# Patient Record
Sex: Female | Born: 1960 | Race: Black or African American | Hispanic: No | Marital: Married | Smoking: Current every day smoker
Health system: Southern US, Community
[De-identification: ages and names within clinical notes are randomized; demographics above are authoritative.]

## PROBLEM LIST (undated history)

## (undated) DIAGNOSIS — K859 Acute pancreatitis without necrosis or infection, unspecified: Secondary | ICD-10-CM

## (undated) DIAGNOSIS — I10 Essential (primary) hypertension: Secondary | ICD-10-CM

---

## 2019-04-20 ENCOUNTER — Emergency Department (HOSPITAL_COMMUNITY)
Admission: EM | Admit: 2019-04-20 | Discharge: 2019-04-20 | Disposition: A | Discharge status: Home / Self Care | Payer: Self-pay | Attending: Emergency Medicine | Admitting: Emergency Medicine

## 2019-04-20 ENCOUNTER — Other Ambulatory Visit: Payer: Self-pay

## 2019-04-20 ENCOUNTER — Emergency Department (HOSPITAL_COMMUNITY): Payer: Self-pay

## 2019-04-20 ENCOUNTER — Encounter (HOSPITAL_COMMUNITY): Payer: Self-pay | Admitting: Emergency Medicine

## 2019-04-20 DIAGNOSIS — R079 Chest pain, unspecified: Secondary | ICD-10-CM | POA: Insufficient documentation

## 2019-04-20 DIAGNOSIS — R1011 Right upper quadrant pain: Secondary | ICD-10-CM | POA: Insufficient documentation

## 2019-04-20 DIAGNOSIS — I10 Essential (primary) hypertension: Secondary | ICD-10-CM | POA: Insufficient documentation

## 2019-04-20 DIAGNOSIS — F17210 Nicotine dependence, cigarettes, uncomplicated: Secondary | ICD-10-CM | POA: Insufficient documentation

## 2019-04-20 HISTORY — DX: Acute pancreatitis without necrosis or infection, unspecified: K85.90

## 2019-04-20 HISTORY — DX: Essential (primary) hypertension: I10

## 2019-04-20 LAB — CBC WITH DIFFERENTIAL/PLATELET
Abs Immature Granulocytes: 0.04 10*3/uL (ref 0.00–0.07)
Basophils Absolute: 0.1 10*3/uL (ref 0.0–0.1)
Basophils Relative: 1 %
Eosinophils Absolute: 0.1 10*3/uL (ref 0.0–0.5)
Eosinophils Relative: 0 %
HCT: 42.9 % (ref 36.0–46.0)
Hemoglobin: 14.1 g/dL (ref 12.0–15.0)
Immature Granulocytes: 0 %
Lymphocytes Relative: 21 %
Lymphs Abs: 2.7 10*3/uL (ref 0.7–4.0)
MCH: 30.2 pg (ref 26.0–34.0)
MCHC: 32.9 g/dL (ref 30.0–36.0)
MCV: 91.9 fL (ref 80.0–100.0)
Monocytes Absolute: 0.6 10*3/uL (ref 0.1–1.0)
Monocytes Relative: 5 %
Neutro Abs: 9.4 10*3/uL — ABNORMAL HIGH (ref 1.7–7.7)
Neutrophils Relative %: 73 %
Platelets: 264 10*3/uL (ref 150–400)
RBC: 4.67 MIL/uL (ref 3.87–5.11)
RDW: 12.8 % (ref 11.5–15.5)
WBC: 12.8 10*3/uL — ABNORMAL HIGH (ref 4.0–10.5)
nRBC: 0 % (ref 0.0–0.2)

## 2019-04-20 LAB — COMPREHENSIVE METABOLIC PANEL
ALT: 13 U/L (ref 0–44)
AST: 15 U/L (ref 15–41)
Albumin: 3.7 g/dL (ref 3.5–5.0)
Alkaline Phosphatase: 100 U/L (ref 38–126)
Anion gap: 11 (ref 5–15)
BUN: 5 mg/dL — ABNORMAL LOW (ref 6–20)
CO2: 24 mmol/L (ref 22–32)
Calcium: 9 mg/dL (ref 8.9–10.3)
Chloride: 104 mmol/L (ref 98–111)
Creatinine, Ser: 0.61 mg/dL (ref 0.44–1.00)
GFR calc Af Amer: >60 mL/min (ref >60)
GFR calc non Af Amer: >60 mL/min (ref >60)
Glucose, Bld: 136 mg/dL — ABNORMAL HIGH (ref 70–99)
Potassium: 3.9 mmol/L (ref 3.5–5.1)
Sodium: 139 mmol/L (ref 135–145)
Total Bilirubin: 0.5 mg/dL (ref 0.3–1.2)
Total Protein: 7.4 g/dL (ref 6.5–8.1)

## 2019-04-20 LAB — D-DIMER, QUANTITATIVE: D-Dimer, Quant: 1.03 ug/mL-FEU — ABNORMAL HIGH (ref 0.00–0.50)

## 2019-04-20 LAB — TROPONIN I
Troponin I: <0.03 ng/mL (ref <0.03)
Troponin I: <0.03 ng/mL (ref <0.03)

## 2019-04-20 LAB — LIPASE, BLOOD: Lipase: 31 U/L (ref 11–51)

## 2019-04-20 MED ORDER — NITROGLYCERIN 0.4 MG SL SUBL
0.4000 mg | SUBLINGUAL_TABLET | SUBLINGUAL | Status: DC | PRN
  Filled 2019-04-20: qty 1

## 2019-04-20 MED ORDER — MORPHINE SULFATE (PF) 4 MG/ML IV SOLN
4.0000 mg | Freq: Once | INTRAVENOUS | Status: AC
  Administered 2019-04-20: 4 mg via INTRAVENOUS
  Filled 2019-04-20: qty 1

## 2019-04-20 MED ORDER — ASPIRIN 81 MG PO CHEW
324.0000 mg | CHEWABLE_TABLET | Freq: Once | ORAL | Status: AC
  Administered 2019-04-20: 16:00:00 324 mg via ORAL
  Filled 2019-04-20: qty 4

## 2019-04-20 MED ORDER — IOHEXOL 350 MG/ML SOLN
100.0000 mL | Freq: Once | INTRAVENOUS | Status: AC | PRN
  Administered 2019-04-20: 19:00:00 100 mL via INTRAVENOUS

## 2019-04-20 MED ORDER — SODIUM CHLORIDE 0.9 % IV BOLUS
1000.0000 mL | Freq: Once | INTRAVENOUS | Status: AC
  Administered 2019-04-20: 1000 mL via INTRAVENOUS

## 2019-04-20 NOTE — ED Notes (Signed)
Pt left AMA with signatures obtained. She was repeatedly encouraged to stay, but was adamant about leaving. IV access was d/c.   Opportunity for questioning and answers were provided. Armband removed by staff, pt left AMA from ED.

## 2019-04-20 NOTE — ED Triage Notes (Signed)
Pt here from home visiting from Out of town with c/o chest pain and abd pain , forst HR 150 now down to 107 , pt received 4mg  of Zofran

## 2019-04-20 NOTE — ED Provider Notes (Signed)
[facility] EMERGENCY DEPARTMENT Provider Note   CSN: [id number] Arrival date & time: 04/20/19  1542    History   Chief Complaint Chief Complaint  Patient presents with  . Chest Pain    HPI Sabrina Zuniga is a 58 y.o. female.     The history is provided by the patient and the EMS personnel. No language interpreter was used.  Chest Pain  Pain location:  Substernal area, epigastric and L chest Pain quality: aching, crushing and pressure   Pain radiates to:  L shoulder Pain severity:  Severe Onset quality:  Sudden Duration:  2 hours Timing:  Constant Progression:  Unchanged Chronicity:  New Context: at rest   Context: not breathing   Worsened by:  Nothing Ineffective treatments:  None tried Associated symptoms: abdominal pain, nausea, palpitations and shortness of breath   Associated symptoms: no altered mental status, no anxiety, no back pain, no cough, no diaphoresis, no fatigue, no fever, no headache, no heartburn, no lower extremity edema, no near-syncope, no vomiting and no weakness   Risk factors: hypertension and smoking   Risk factors: no coronary artery disease, no diabetes mellitus, not female and no prior DVT/PE     Past Medical History:  Diagnosis Date  . Hypertension   . Pancreatitis     There are no active problems to display for this patient.   History reviewed. No pertinent surgical history.   OB History   No obstetric history on file.      Home Medications    Prior to Admission medications   Not on File    Family History History reviewed. No pertinent family history.  Social History Social History   Tobacco Use  . Smoking status: Current Every Day Smoker  . Smokeless tobacco: Never Used  Substance Use Topics  . Alcohol use: Never    Frequency: Never  . Drug use: Never     Allergies   Hydrocodone   Review of Systems Review of Systems  Constitutional: Negative for chills, diaphoresis, fatigue and fever.   HENT: Negative for congestion and rhinorrhea.   Respiratory: Positive for chest tightness and shortness of breath. Negative for cough, wheezing and stridor.   Cardiovascular: Positive for chest pain and palpitations. Negative for leg swelling and near-syncope.  Gastrointestinal: Positive for abdominal pain and nausea. Negative for constipation, diarrhea, heartburn and vomiting.  Genitourinary: Negative for flank pain and frequency.  Musculoskeletal: Negative for back pain, neck pain and neck stiffness.  Skin: Negative for rash and wound.  Neurological: Negative for weakness, light-headedness and headaches.  Psychiatric/Behavioral: Negative for agitation.  All other systems reviewed and are negative.    Physical Exam Updated Vital Signs BP (!) 193/90   Pulse (!) 107   Temp 98 F (36.7 C) (Oral)   Resp (!) 21   Ht  (1.6 m)   Wt 59 kg   SpO2 100%   BMI 23.03 kg/m   Physical Exam Vitals signs and nursing note reviewed.  Constitutional:      General: She is not in acute distress.    Appearance: She is well-developed. She is not ill-appearing, toxic-appearing or diaphoretic.  HENT:     Head: Normocephalic and atraumatic.  Eyes:     Conjunctiva/sclera: Conjunctivae normal.     Pupils: Pupils are equal, round, and reactive to light.  Neck:     Musculoskeletal: Neck supple.  Cardiovascular:     Rate and Rhythm: Normal rate and regular rhythm.  Heart sounds: No murmur.  Pulmonary:     Effort: Pulmonary effort is normal. No respiratory distress.     Breath sounds: Normal breath sounds. No decreased breath sounds, wheezing, rhonchi or rales.  Abdominal:     Palpations: Abdomen is soft.     Tenderness: There is abdominal tenderness in the right upper quadrant and epigastric area. There is no right CVA tenderness or left CVA tenderness.    Musculoskeletal:     Right lower leg: She exhibits no tenderness. No edema.     Left lower leg: She exhibits no tenderness. No edema.   Skin:    General: Skin is warm and dry.     Capillary Refill: Capillary refill takes less than 2 seconds.  Neurological:     General: No focal deficit present.     Mental Status: She is alert.  Psychiatric:        Mood and Affect: Mood normal.      ED Treatments / Results  Labs (all labs ordered are listed, but only abnormal results are displayed) Labs Reviewed  CBC WITH DIFFERENTIAL/PLATELET - Abnormal; Notable for the following components:      Result Value   WBC 12.8 (*)    Neutro Abs 9.4 (*)    All other components within normal limits  COMPREHENSIVE METABOLIC PANEL - Abnormal; Notable for the following components:   Glucose, Bld 136 (*)    BUN 5 (*)    All other components within normal limits  D-DIMER, QUANTITATIVE (NOT AT [facility]) - Abnormal; Notable for the following components:   D-Dimer, Quant 1.03 (*)    All other components within normal limits  LIPASE, BLOOD  TROPONIN I  TROPONIN I    EKG None ED ECG REPORT   Date: 04/20/2019  Rate: 108  Rhythm: sinus tachycardia  QRS Axis: normal  Intervals: borderline PR prolonged  ST/T Wave abnormalities: normal  Conduction Disutrbances:none  Narrative Interpretation:   Old EKG Reviewed: none available  I have personally reviewed the EKG tracing and agree with the computerized printout as noted.    Radiology Dg Chest 2 View  Result Date: 04/20/2019 CLINICAL DATA:  Chest pain.  Abdominal pain.  Tachycardia. EXAM: CHEST - 2 VIEW COMPARISON:  None. FINDINGS: The heart is mildly enlarged. Normal vascularity. Lungs are under aerated and grossly clear. No pneumothorax or pleural effusion. IMPRESSION: Cardiomegaly without decompensation. Electronically Signed   By: Jolaine Click M.D.   On: 04/20/2019 17:52   Ct Angio Chest Pe W And/or Wo Contrast  Result Date: 04/20/2019 CLINICAL DATA:  Sudden onset of chest pain EXAM: CT ANGIOGRAPHY CHEST WITH CONTRAST TECHNIQUE: Multidetector CT imaging of the chest was performed  using the standard protocol during bolus administration of intravenous contrast. Multiplanar CT image reconstructions and MIPs were obtained to evaluate the vascular anatomy. CONTRAST:  OMNIPAQUE IOHEXOL 350 MG/ML SOLN COMPARISON:  Chest x-ray 04/20/2019 FINDINGS: Cardiovascular: Satisfactory opacification of the pulmonary arteries to the segmental level. No evidence of pulmonary embolism. Nonaneurysmal aorta. Coronary vascular calcification. Mediastinum/Nodes: No enlarged mediastinal, hilar, or axillary lymph nodes. Thyroid gland, trachea, and esophagus demonstrate no significant findings. Lungs/Pleura: Mild emphysema. Hazy atelectasis at the posterior bases. No consolidation or pleural effusion. Upper Abdomen: Scattered pancreatic calcifications and probable ductal dilatation consistent with chronic pancreatitis. Musculoskeletal: No chest wall abnormality. No acute or significant osseous findings. Review of the MIP images confirms the above findings. IMPRESSION: 1. Negative for acute pulmonary embolus. 2. Mild emphysema without focal pulmonary infiltrate 3. Chronic  pancreatitis Emphysema (ICD10-J43.9). Electronically Signed   By: Jasmine Pang M.D.   On: 04/20/2019 19:12   US Abdomen Limited Ruq  Result Date: 04/20/2019 CLINICAL DATA:  Chest and right upper quadrant pain EXAM: ULTRASOUND ABDOMEN LIMITED RIGHT UPPER QUADRANT COMPARISON:  CT 04/20/2019 FINDINGS: Gallbladder: 5 mm anti dependent echogenic focus anterior wall gallbladder suggesting a small polyp. Normal wall thickness. Negative sonographic Murphy Common bile duct: Diameter: 2 mm Liver: Possible echogenic mass within the posterior right hepatic lobe measuring 2.2 x 2.7 x 2.8 cm portal vein is patent on color Doppler imaging with normal direction of blood flow towards the liver. IMPRESSION: 1. Small gallbladder polyp. Negative for cholelithiasis, acute cholecystitis or biliary dilatation 2. 2.8 cm possible echogenic mass in the posterior right  hepatic lobe, possible hemangioma. This could be evaluated with nonemergent MRI. Electronically Signed   By: Jasmine Pang M.D.   On: 04/20/2019 19:21    Procedures Procedures (including critical care time)  Medications Ordered in ED Medications  nitroGLYCERIN (NITROSTAT) SL tablet 0.4 mg (has no administration in time range)  aspirin chewable tablet 324 mg (324 mg Oral Given 04/20/19 1606)  sodium chloride 0.9 % bolus 1,000 mL (1,000 mLs Intravenous New Bag/Given 04/20/19 1609)     Initial Impression / Assessment and Plan / ED Course  I have reviewed the triage vital signs and the nursing notes.  Pertinent labs & imaging results that were available during my care of the patient were reviewed by me and considered in my medical decision making (see chart for details).        Sabrina Zuniga is a 58 y.o. female with a past medical history significant for hypertension, prior pancreatitis, and prior "gallbladder trouble" who presents with sudden onset chest pain, shortness of breath, and upper abdominal pain.  Patient reports that she was at her baseline normal health until approximately 1/2 hours ago when she had sudden onset of pain.  She reports the pain is an 8 out of 10 and has been constant since onset.  She reports associated shortness of breath and some mild nausea.  She denies emesis.  She denies diaphoresis.  She reports the pain radiated from her upper abdomen and chest towards her left shoulder.  She denies any syncopal episodes but does report some lightheadedness and palpitations.  EMS reports patient was tachycardic around 150 on arrival.  They reports it was a sinus tachycardia and then started having trigeminy and bigeminy patterns with PVCs.  Patient reports no previous cardiac history no family cardiac history.  Patient does smoke.  She reports chest discomfort feels like a pressure in her central chest.  She denies any urinary symptoms constipation, or diarrhea.  She denies recent  trauma.  No recent medication changes, alcohol use, or drug use.  She denies other complaints on arrival.  On exam, patient's chest is nontender.  Patient does have some epigastric and right upper quadrant tenderness on exam.  Lungs were clear and no murmur was appreciated.  Patient is symmetric pulses in upper and lower extremities.  No leg pain or leg swelling.  Patient denies history of DVT or PE.  Patient is tachycardic on arrival in the 110-120 range.   EKG shows sinus tachycardia with no evidence of STEMI.  PVCs, bigeminy, or trigeminy seen on initial EKG here.  Based on patient's symptoms, she will have work-up including labs, imaging, and she will be given fluids.  She also given aspirin and nitroglycerin for her chest pain.  Will send d-dimer given her tachycardia and her age as she is not appropriate for PERC rule.  Pain does not radiate towards back and she has no lower extremity symptoms, lower suspicion for an aortic cause of symptoms at this time.  Patient reports that she just got in from [place] to check in on family during the coronavirus.  She denies any recent fevers, chills, congestion, or cough.  If d-dimer is positive, will obtain PE study.  Will get right upper quadrant ultrasound given her history of reported gallbladder problems and a tenderness in this location.  Heart score calculated as a 3.  Delta troponin was negative.  PE study showed no pulmonary embolism or pneumonia.  Chronic pancreatitis seen.  Right upper quadrant ultrasound did not show evidence of acute cholecystitis or biliary dilation.  An echogenic mass was seen in hepatic lobe possible angioma which she needs outpatient nonemergent MRI to evaluate.  While waiting for her results, patient eloped from the emergency department.  Had patient waited for her results, she would have been discharged thus, do not feel she needs to be brought back in at this time.  Anticipate patient will return if symptoms do not  improve however she does not appear to have a life-threatening condition at this time.  Patient eloped in stable condition.   Final Clinical Impressions(s) / ED Diagnoses   Final diagnoses:  RUQ abdominal pain  Nonspecific chest pain    Clinical Impression: 1. Nonspecific chest pain   2. RUQ abdominal pain     Disposition: Eloped  Condition: Stable   Discharge Medication List as of 04/20/2019 10:38 PM      Follow Up: No follow-up provider specified.    Tegeler, Canary Brimhristopher J, MD 04/20/19 [phone]

## 2020-02-21 IMAGING — US ULTRASOUND ABDOMEN LIMITED
1 series · 14 of 25 positions shown · non-contrast
Comparison: CT 04/20/2019

CLINICAL DATA: Chest and right upper quadrant pain

EXAM:
ULTRASOUND ABDOMEN LIMITED RIGHT UPPER QUADRANT

[Series 2: ultrasound abdomen limited · 14 of 79 slices shown]
[im 1/79]
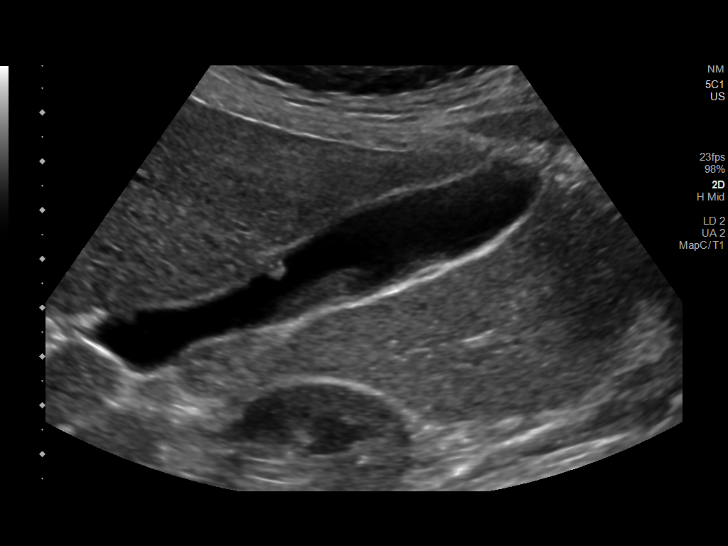
[im 7/79]
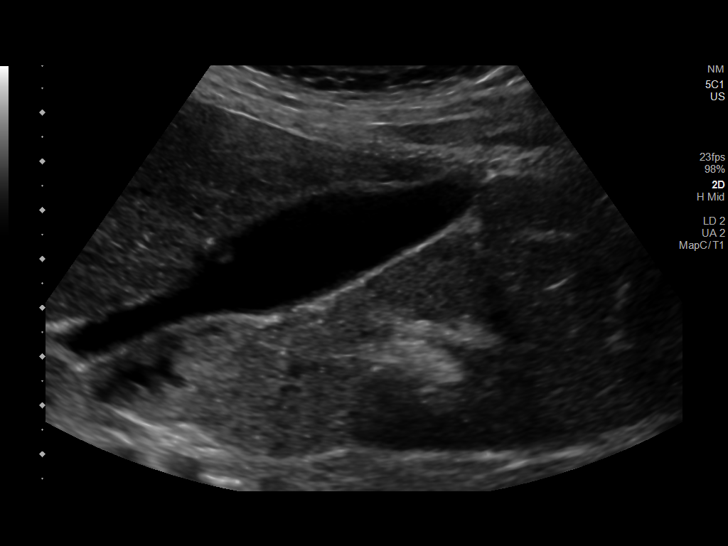
[im 14/79]
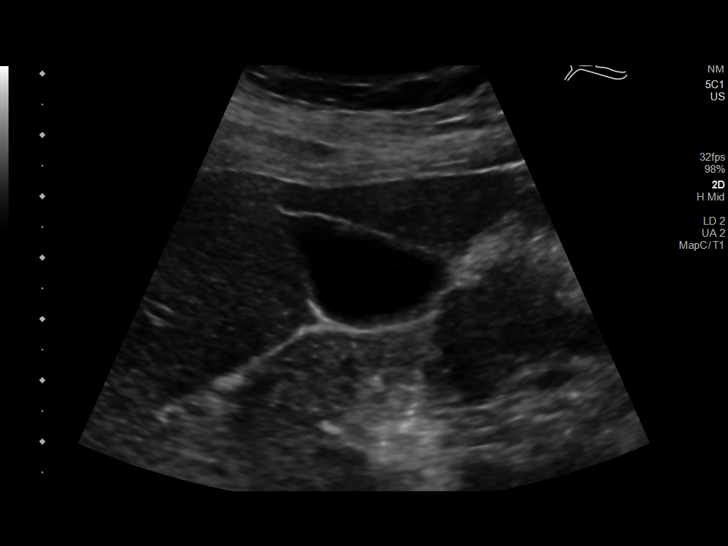
[im 20/79]
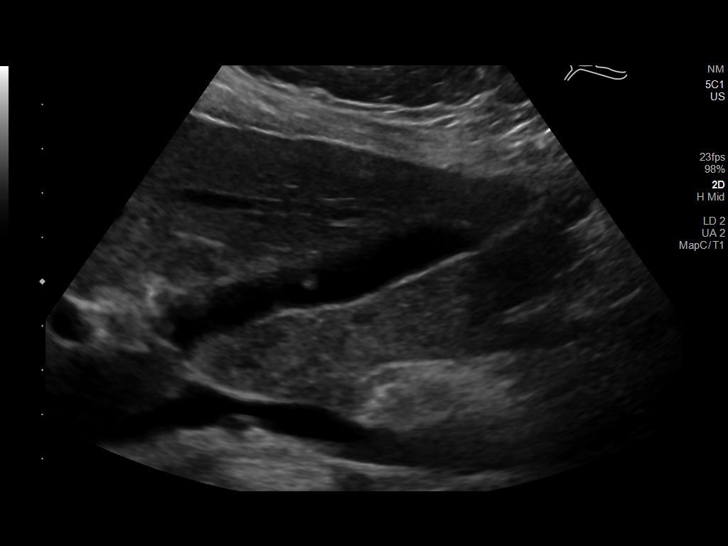
[im 27/79]
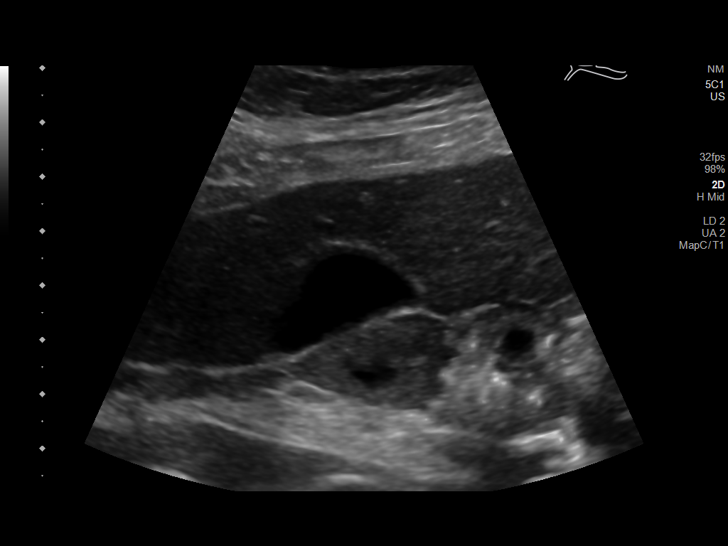
[im 30/79]
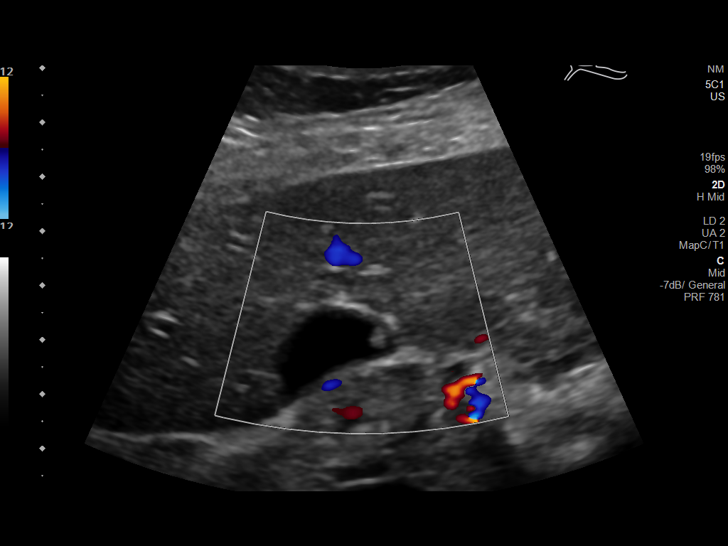
[im 36/79]
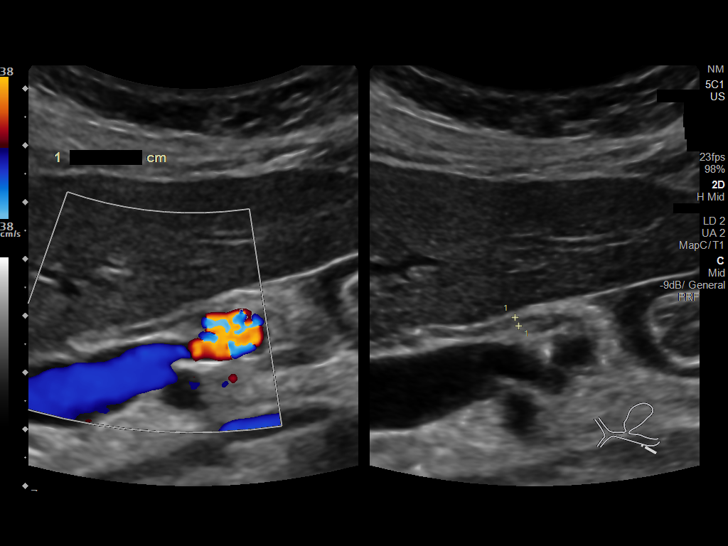
[im 43/79]
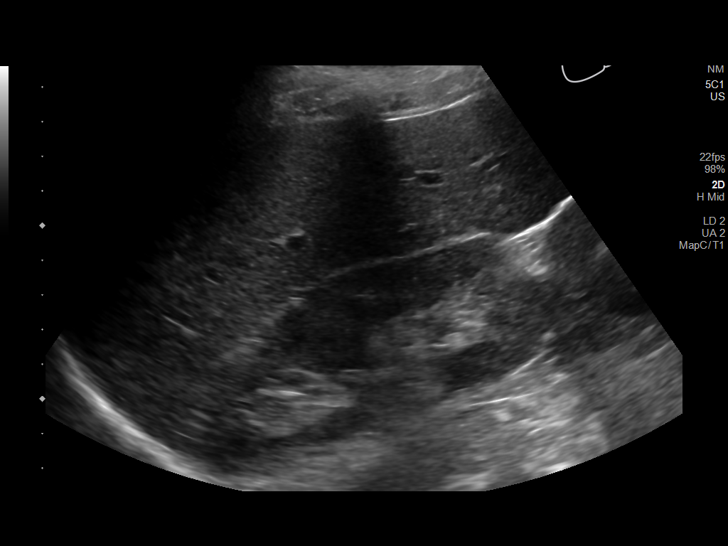
[im 49/79]
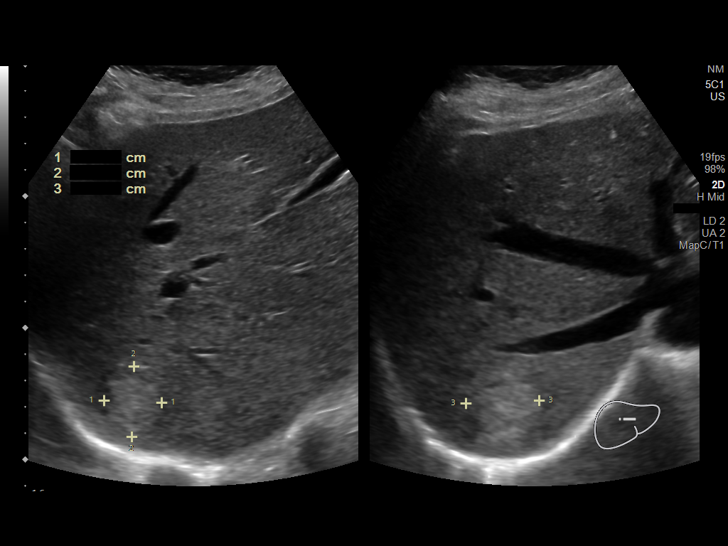
[im 53/79]
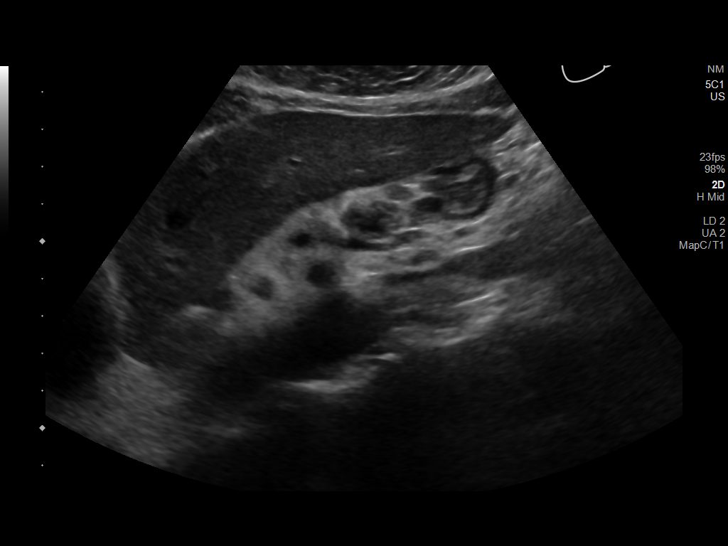
[im 59/79]
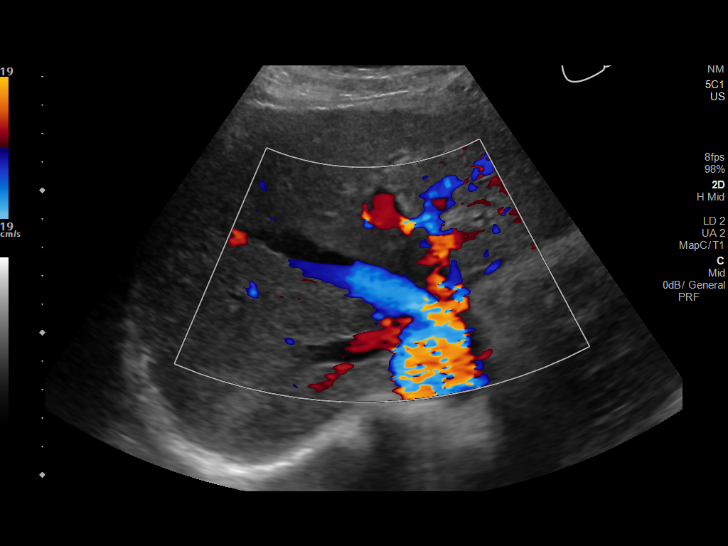
[im 66/79]
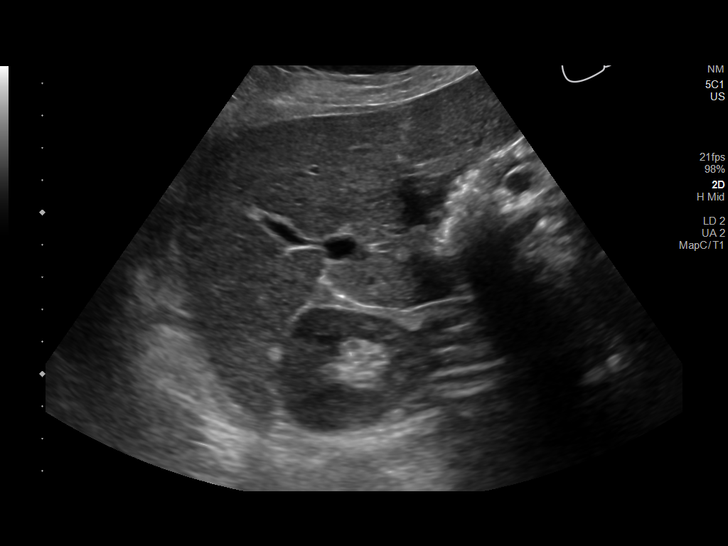
[im 72/79]
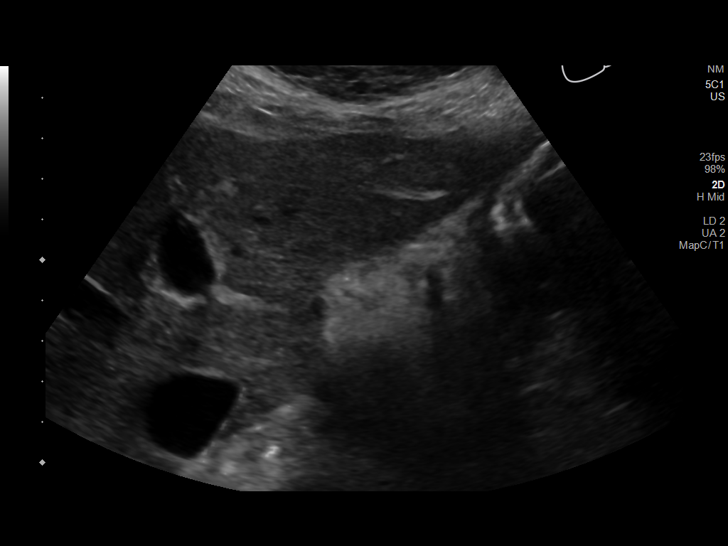
[im 79/79]
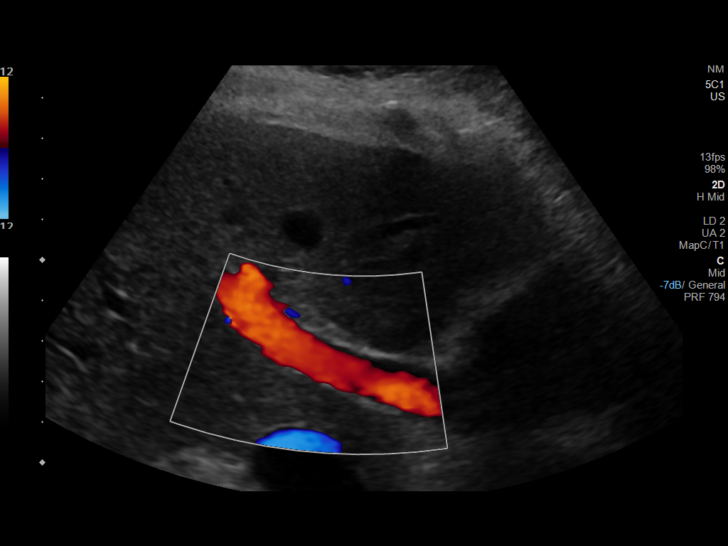

[14 of 25 positions shown; findings below may reference images not displayed]

FINDINGS: Gallbladder:

5 mm anti dependent echogenic focus anterior wall gallbladder
suggesting a small polyp. Normal wall thickness. Negative
sonographic Murphy

Common bile duct:

Diameter: 2 mm

Liver:

Possible echogenic mass within the posterior right hepatic lobe
measuring 2.2 x 2.7 x 2.8 cm portal vein is patent on color Doppler
imaging with normal direction of blood flow towards the liver.
IMPRESSION: 1. Small gallbladder polyp. Negative for cholelithiasis, acute
cholecystitis or biliary dilatation
2. 2.8 cm possible echogenic mass in the posterior right hepatic
lobe, possible hemangioma. This could be evaluated with nonemergent
MRI.

## 2020-02-21 IMAGING — CT CT ANGIOGRAPHY CHEST
2 of 8 series · 19 of 46 positions shown · IV contrast (omnipaque)
Comparison: Chest x-ray 04/20/2019

CLINICAL DATA: Sudden onset of chest pain

EXAM:
CT ANGIOGRAPHY CHEST WITH CONTRAST
TECHNIQUE: Multidetector CT imaging of the chest was performed using the
standard protocol during bolus administration of intravenous
contrast. Multiplanar CT image reconstructions and MIPs were
obtained to evaluate the vascular anatomy.
CONTRAST:  100mL OMNIPAQUE IOHEXOL 350 MG/ML SOLN

[Series 6: thins · axial · 0.76mm/px · z∈[+958,+1225]mm · 16 of 295 slices shown]
[im 14/295  lung]
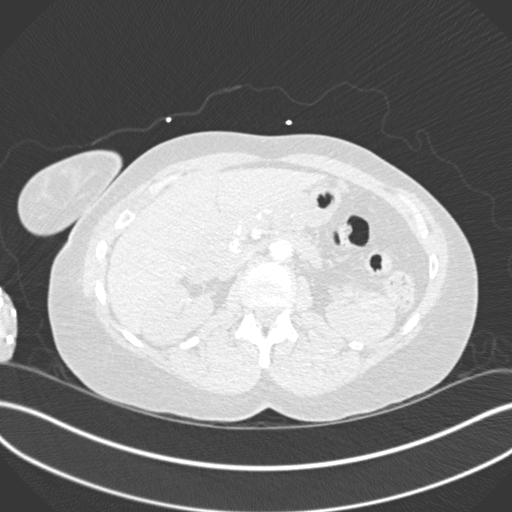
[im 27/295  soft-tissue]
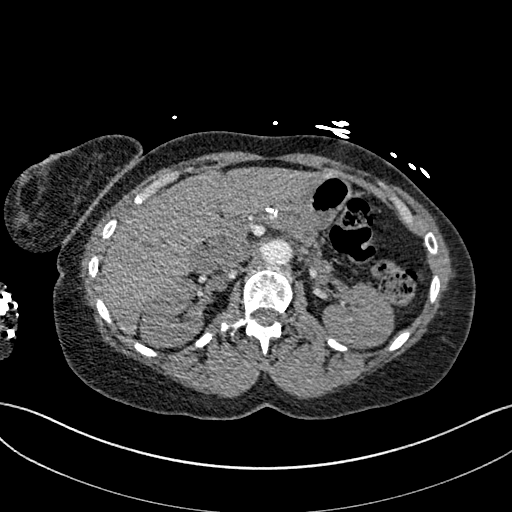
[im 54/295  lung]
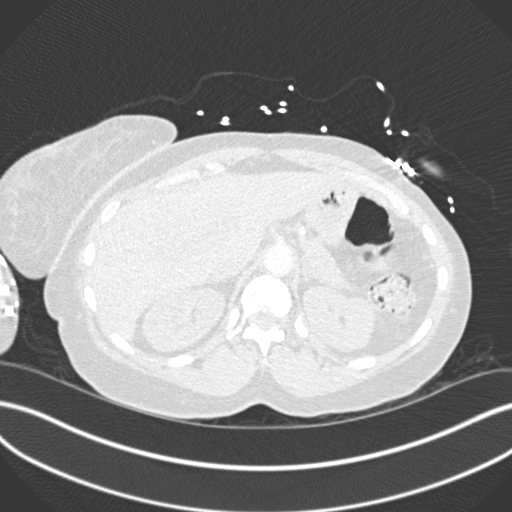
[im 67/295  soft-tissue]
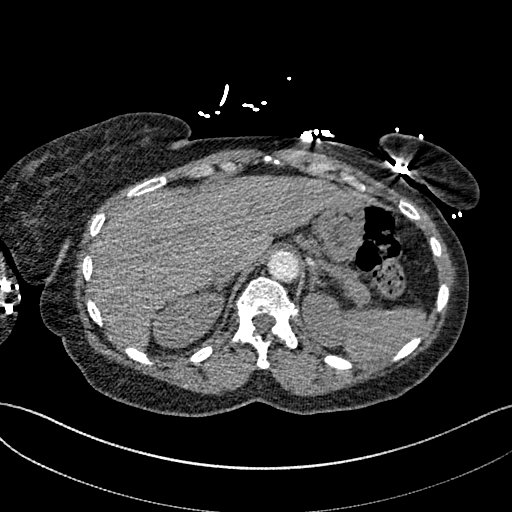
[im 81/295  lung]
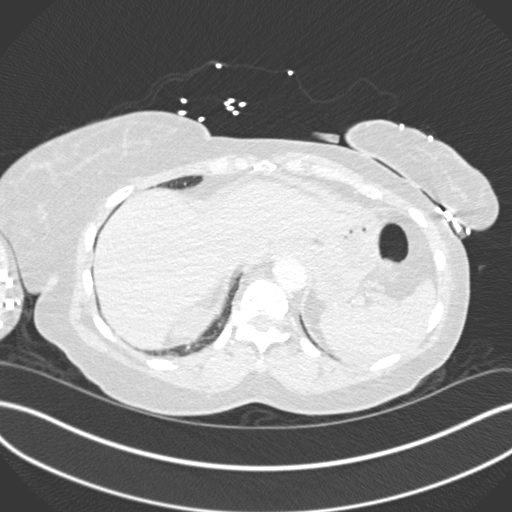
[im 107/295  soft-tissue]
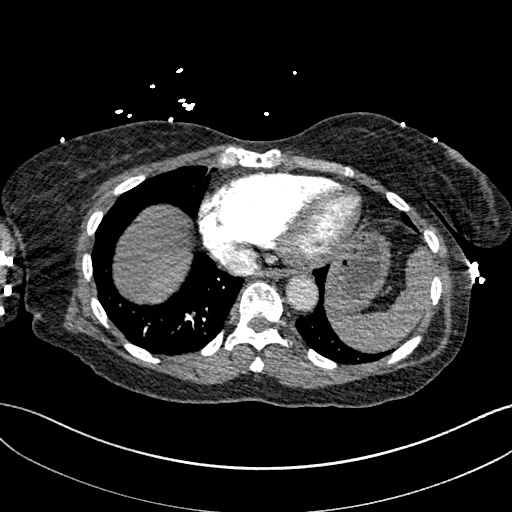
[im 121/295  lung]
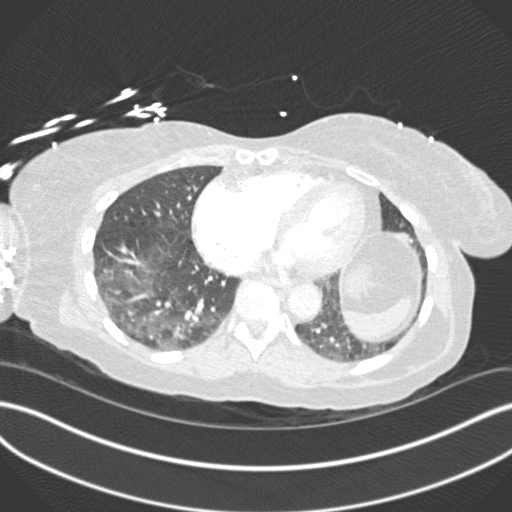
[im 134/295  soft-tissue]
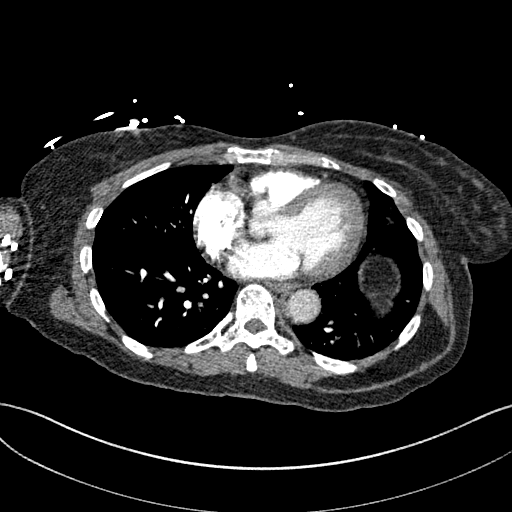
[im 161/295  lung]
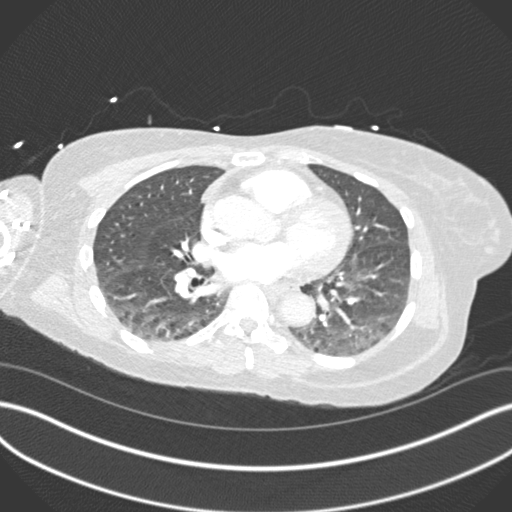
[im 174/295  soft-tissue]
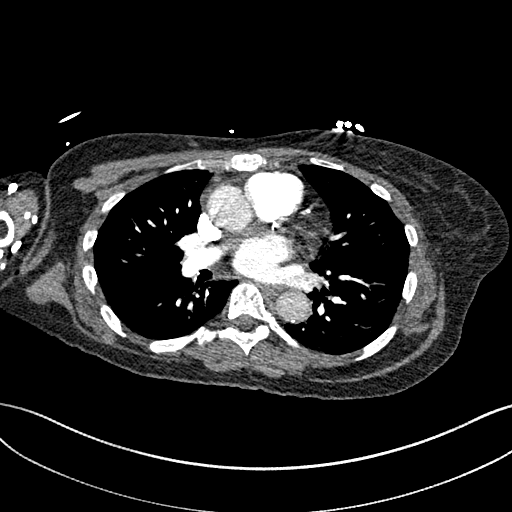
[im 188/295  lung]
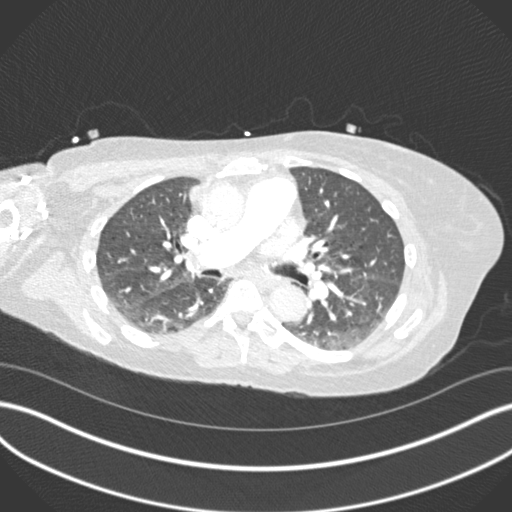
[im 214/295  soft-tissue]
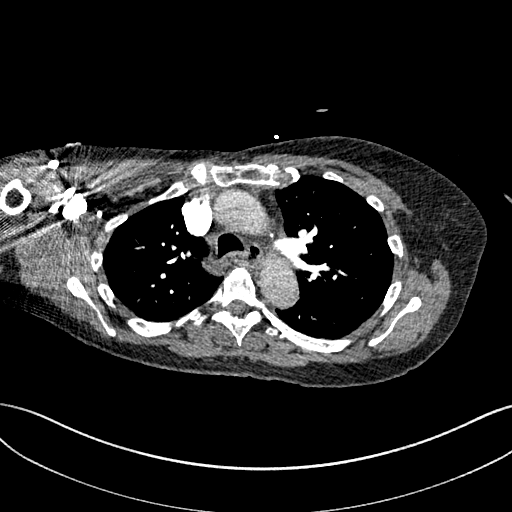
[im 228/295  lung]
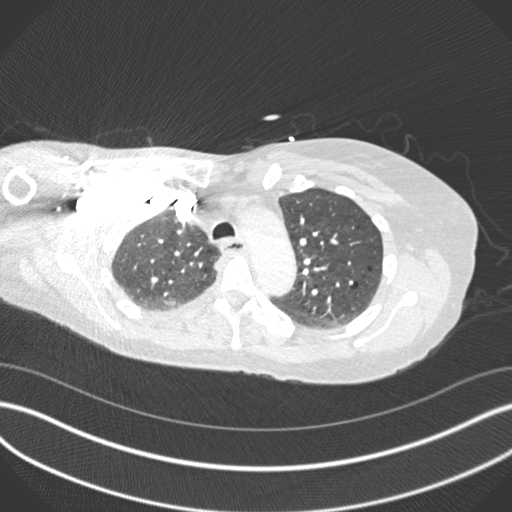
[im 241/295  soft-tissue]
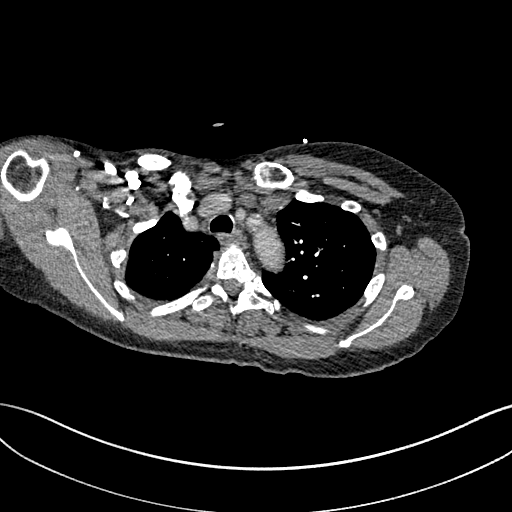
[im 268/295  lung]
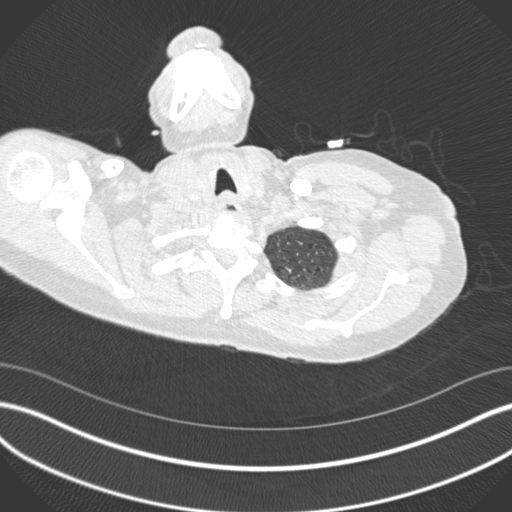
[im 281/295  soft-tissue]
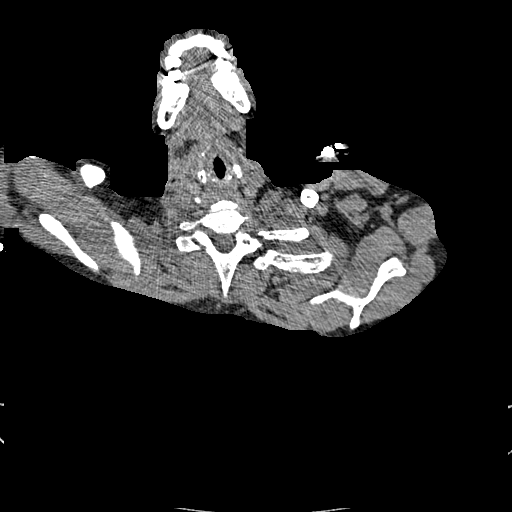

[Series 8: coronal mpr · coronal · 0.60mm/px · 3 of 151 slices shown]
[im 38/151  soft-tissue]
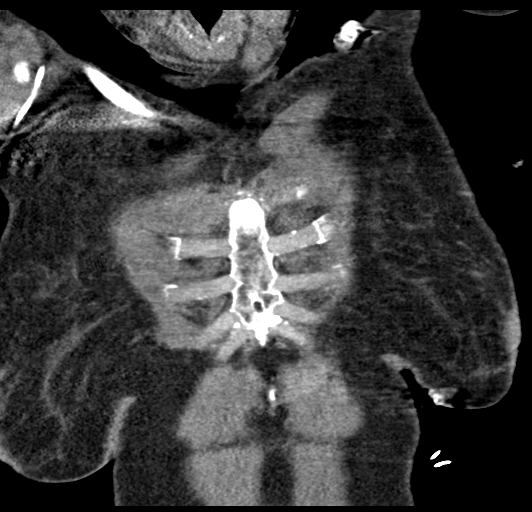
[im 76/151  soft-tissue]
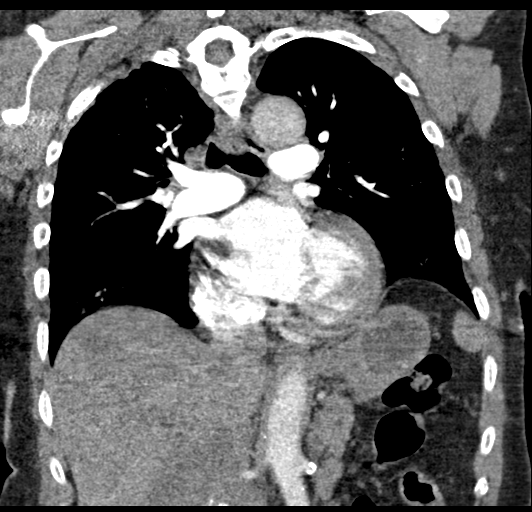
[im 113/151  soft-tissue]
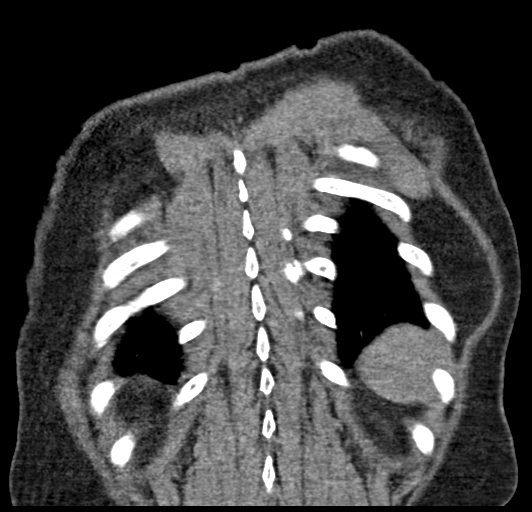

[19 of 46 positions shown; findings below may reference images not displayed]

FINDINGS: Cardiovascular: Satisfactory opacification of the pulmonary arteries
to the segmental level. No evidence of pulmonary embolism.
Nonaneurysmal aorta. Coronary vascular calcification.

Mediastinum/Nodes: No enlarged mediastinal, hilar, or axillary lymph
nodes. Thyroid gland, trachea, and esophagus demonstrate no
significant findings.

Lungs/Pleura: Mild emphysema. Hazy atelectasis at the posterior
bases. No consolidation or pleural effusion.

Upper Abdomen: Scattered pancreatic calcifications and probable
ductal dilatation consistent with chronic pancreatitis.

Musculoskeletal: No chest wall abnormality. No acute or significant
osseous findings.

Review of the MIP images confirms the above findings.
IMPRESSION: 1. Negative for acute pulmonary embolus.
2. Mild emphysema without focal pulmonary infiltrate
3. Chronic pancreatitis

Emphysema ([id number]).

## 2023-12-27 DEATH — deceased
